# Patient Record
Sex: Female | Born: 1955 | Race: White | Hispanic: No | Marital: Married
Health system: Southern US, Community
[De-identification: ages and names within clinical notes are randomized; demographics above are authoritative.]

---

## 2003-04-18 ENCOUNTER — Other Ambulatory Visit: Payer: Self-pay

## 2006-12-18 ENCOUNTER — Other Ambulatory Visit: Payer: Self-pay

## 2006-12-19 ENCOUNTER — Inpatient Hospital Stay: Payer: Self-pay | Admitting: Surgery

## 2006-12-27 ENCOUNTER — Ambulatory Visit: Payer: Self-pay | Admitting: Surgery

## 2010-02-21 ENCOUNTER — Ambulatory Visit: Payer: Self-pay | Admitting: Internal Medicine

## 2012-01-26 ENCOUNTER — Ambulatory Visit: Payer: Self-pay | Admitting: Internal Medicine

## 2012-04-04 ENCOUNTER — Ambulatory Visit: Payer: Self-pay | Admitting: Internal Medicine

## 2013-08-24 ENCOUNTER — Ambulatory Visit: Payer: Self-pay | Admitting: Internal Medicine

## 2015-07-23 DIAGNOSIS — G8929 Other chronic pain: Secondary | ICD-10-CM | POA: Insufficient documentation

## 2015-08-19 ENCOUNTER — Other Ambulatory Visit: Payer: Self-pay | Admitting: Internal Medicine

## 2015-08-19 DIAGNOSIS — Z1231 Encounter for screening mammogram for malignant neoplasm of breast: Secondary | ICD-10-CM

## 2015-09-05 ENCOUNTER — Ambulatory Visit: Payer: Self-pay

## 2015-09-09 ENCOUNTER — Other Ambulatory Visit: Payer: Self-pay | Admitting: Internal Medicine

## 2015-09-09 ENCOUNTER — Ambulatory Visit
Admission: RE | Admit: 2015-09-09 | Discharge: 2015-09-09 | Disposition: A | Discharge status: Home / Self Care | Payer: BLUE CROSS/BLUE SHIELD | Source: Ambulatory Visit | Attending: Internal Medicine | Admitting: Internal Medicine

## 2015-09-09 DIAGNOSIS — Z1231 Encounter for screening mammogram for malignant neoplasm of breast: Secondary | ICD-10-CM

## 2015-11-14 DIAGNOSIS — E785 Hyperlipidemia, unspecified: Secondary | ICD-10-CM | POA: Insufficient documentation

## 2015-11-14 DIAGNOSIS — E039 Hypothyroidism, unspecified: Secondary | ICD-10-CM | POA: Insufficient documentation

## 2015-11-14 DIAGNOSIS — I10 Essential (primary) hypertension: Secondary | ICD-10-CM | POA: Insufficient documentation

## 2015-11-14 DIAGNOSIS — M1990 Unspecified osteoarthritis, unspecified site: Secondary | ICD-10-CM | POA: Insufficient documentation

## 2017-01-26 IMAGING — MG MM DIGITAL SCREENING BILAT W/ TOMO W/ CAD
8 of 13 series · 8 of 29 positions shown · non-contrast
Comparison: Previous exam(s).

CLINICAL DATA: Screening.

EXAM:
2D DIGITAL SCREENING BILATERAL MAMMOGRAM WITH CAD AND ADJUNCT TOMO

[L MLO (1 of 2)]
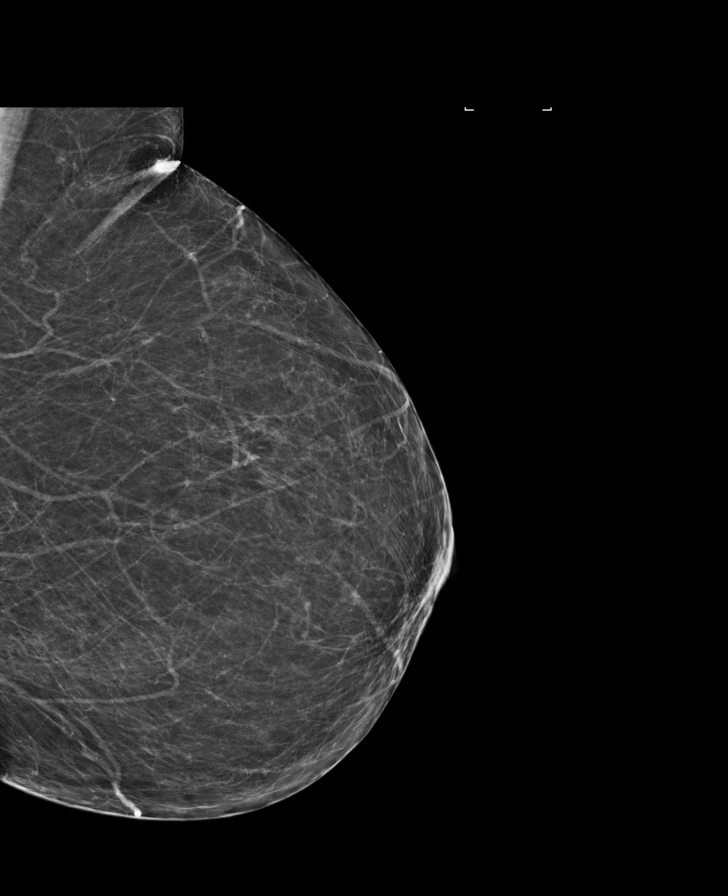

[R MLO synth-2D]
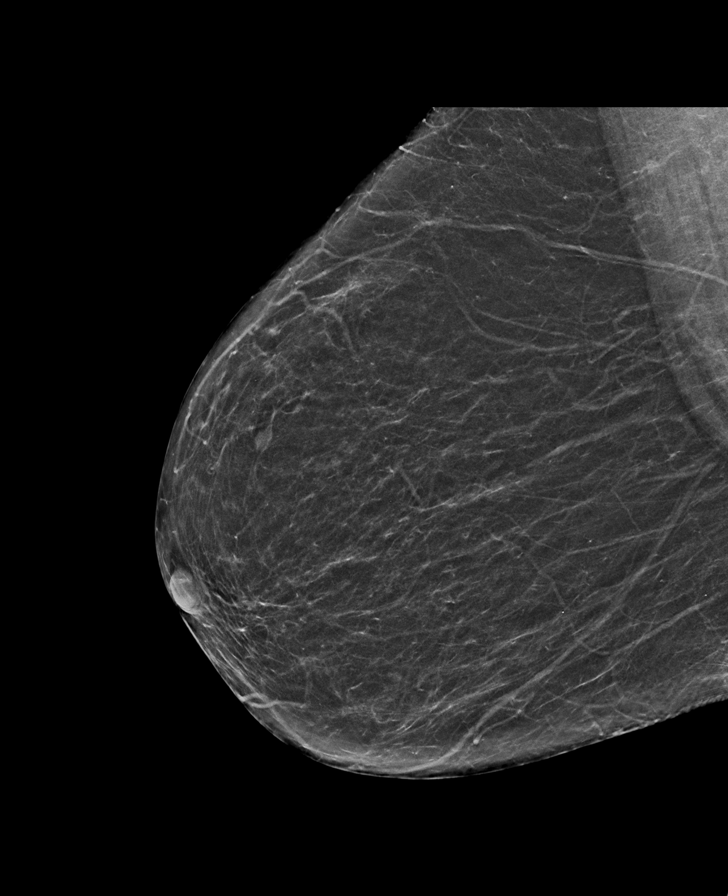

[R CC synth-2D]
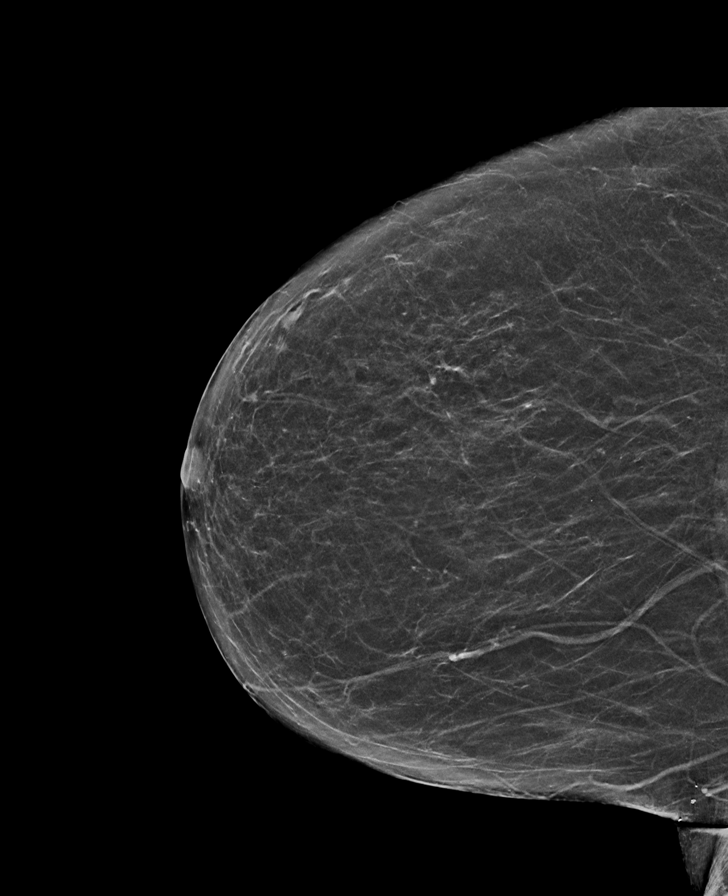

[R CC]
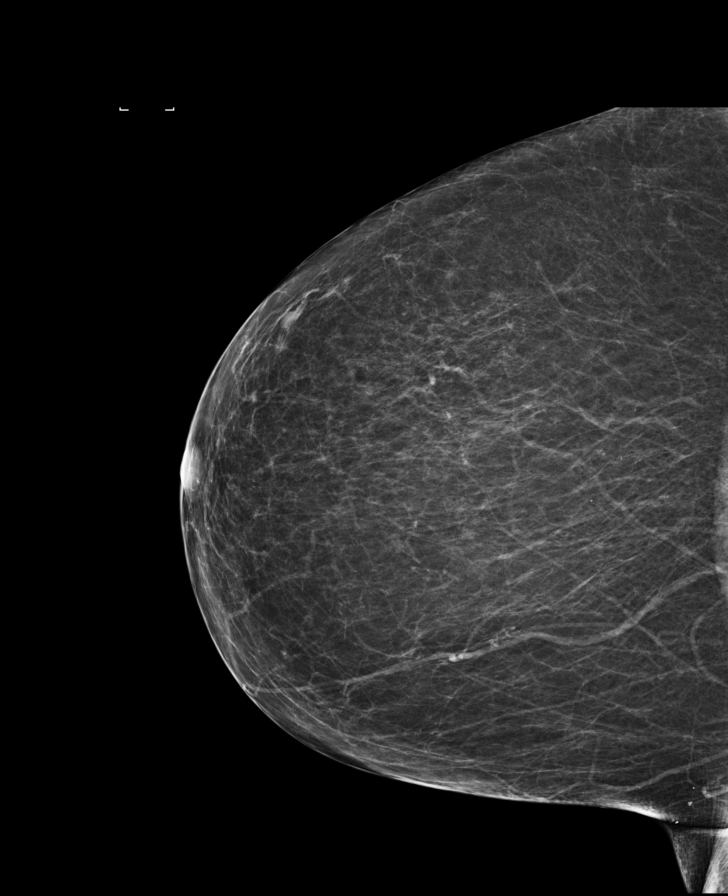

[L MLO synth-2D]
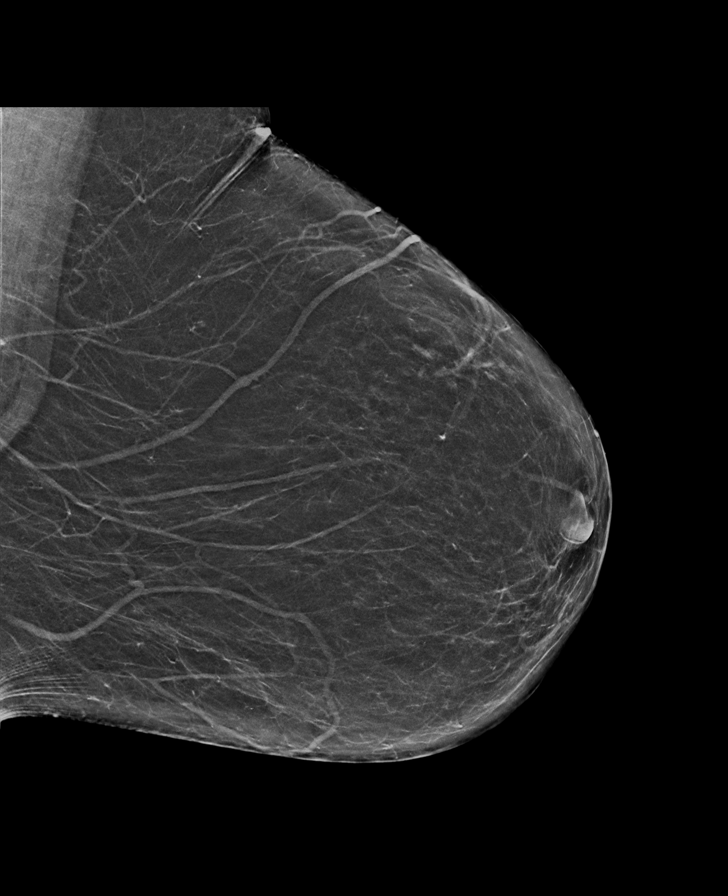

[L CC]
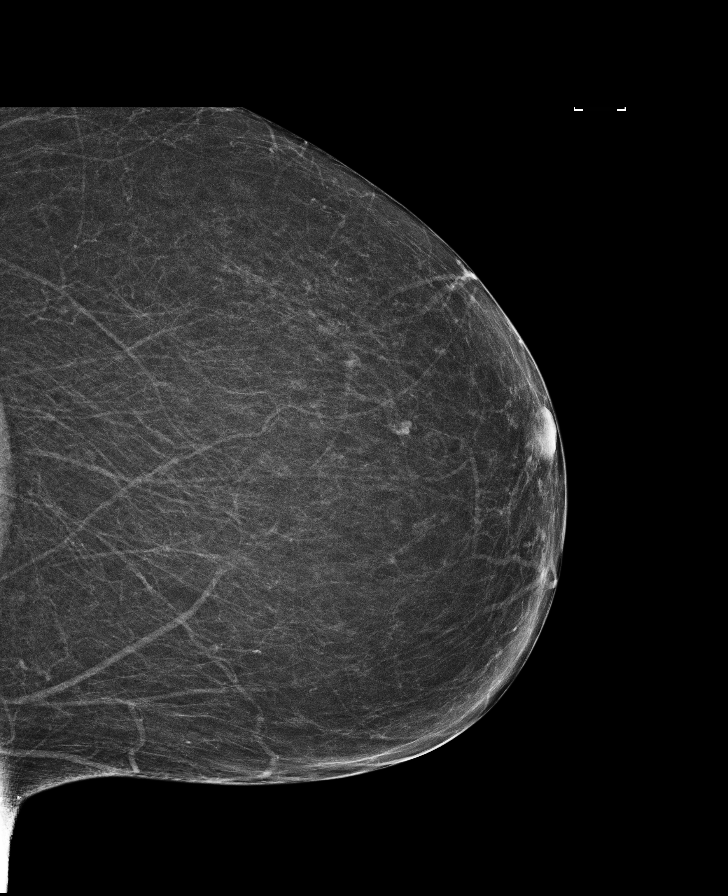

[L CC synth-2D]
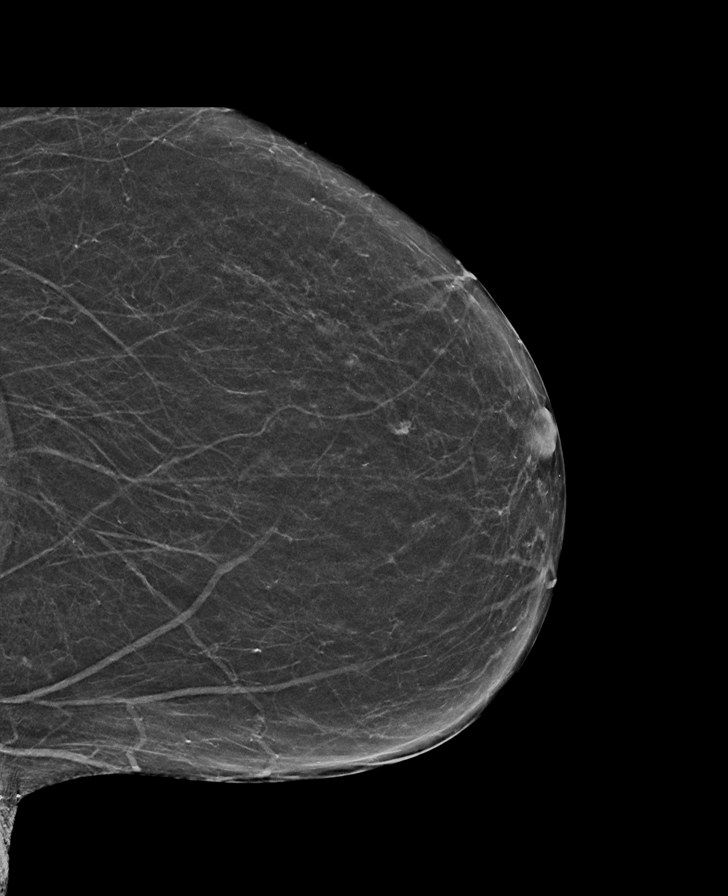

[L MLO (2 of 2)]
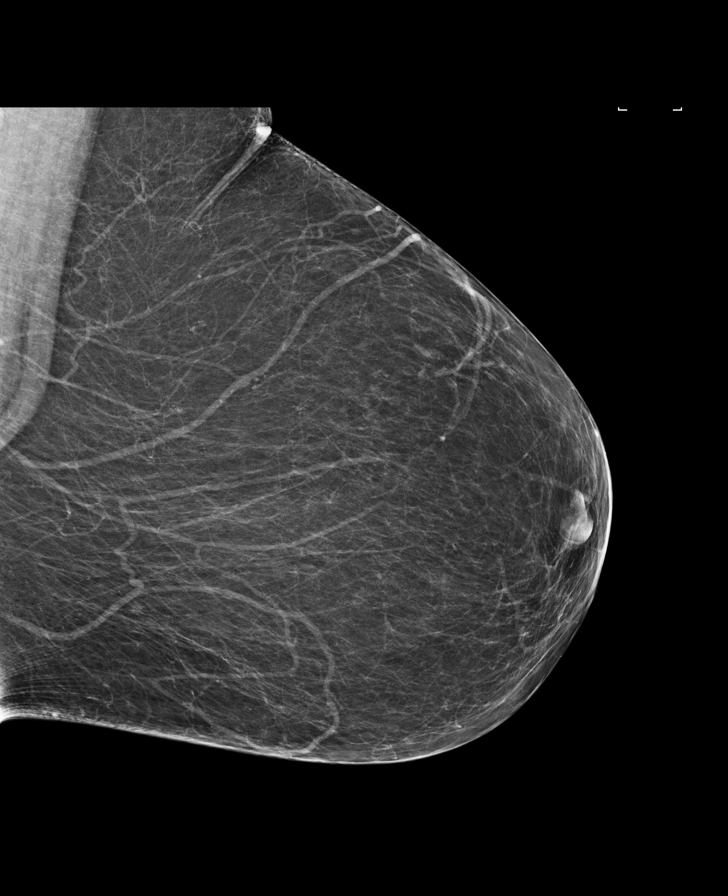

[8 of 29 positions shown; findings below may reference images not displayed]

ACR Breast Density Category b: There are scattered areas of
fibroglandular density.
FINDINGS: There are no findings suspicious for malignancy. Images were
processed with CAD.
IMPRESSION: No mammographic evidence of malignancy. A result letter of this
screening mammogram will be mailed directly to the patient.

RECOMMENDATION:
Screening mammogram in one year. (Code:97-6-RS4)

BI-RADS CATEGORY  1: Negative.

## 2017-03-26 ENCOUNTER — Other Ambulatory Visit: Payer: Self-pay | Admitting: Internal Medicine

## 2017-03-26 DIAGNOSIS — Z1231 Encounter for screening mammogram for malignant neoplasm of breast: Secondary | ICD-10-CM

## 2019-02-24 ENCOUNTER — Ambulatory Visit: Payer: PRIVATE HEALTH INSURANCE | Attending: Internal Medicine

## 2019-02-24 DIAGNOSIS — Z20822 Contact with and (suspected) exposure to covid-19: Secondary | ICD-10-CM

## 2019-02-24 DIAGNOSIS — U071 COVID-19: Secondary | ICD-10-CM | POA: Insufficient documentation

## 2019-02-25 LAB — NOVEL CORONAVIRUS, NAA: SARS-CoV-2, NAA: DETECTED — AB

## 2019-11-15 DIAGNOSIS — M7070 Other bursitis of hip, unspecified hip: Secondary | ICD-10-CM | POA: Insufficient documentation

## 2019-11-15 DIAGNOSIS — M47816 Spondylosis without myelopathy or radiculopathy, lumbar region: Secondary | ICD-10-CM | POA: Insufficient documentation

## 2019-11-15 DIAGNOSIS — M1610 Unilateral primary osteoarthritis, unspecified hip: Secondary | ICD-10-CM | POA: Insufficient documentation

## 2019-11-16 ENCOUNTER — Other Ambulatory Visit: Payer: Self-pay

## 2019-11-16 ENCOUNTER — Ambulatory Visit: Payer: PRIVATE HEALTH INSURANCE | Admitting: Podiatry

## 2019-11-16 ENCOUNTER — Ambulatory Visit (INDEPENDENT_AMBULATORY_CARE_PROVIDER_SITE_OTHER): Payer: PRIVATE HEALTH INSURANCE

## 2019-11-16 ENCOUNTER — Encounter: Payer: Self-pay | Admitting: Podiatry

## 2019-11-16 DIAGNOSIS — M19279 Secondary osteoarthritis, unspecified ankle and foot: Secondary | ICD-10-CM

## 2019-11-16 DIAGNOSIS — M1990 Unspecified osteoarthritis, unspecified site: Secondary | ICD-10-CM | POA: Diagnosis not present

## 2019-11-16 DIAGNOSIS — M205X1 Other deformities of toe(s) (acquired), right foot: Secondary | ICD-10-CM | POA: Diagnosis not present

## 2019-11-16 NOTE — Progress Notes (Signed)
Subjective:  Patient ID: Paige Coffey, female    DOB: 01-26-1956,  MRN: [id number]  Chief Complaint  Patient presents with  . Foot Pain    Patient presents today for pain in right foot/1st mpj and forefoot x 3-4 months    64 y.o. female presents with the above complaint.  Patient presents with complaint of right first metatarsophalangeal joint pain that has been going on for quite some time.  Patient states is painful to touch.  Patient states is been eating for 3 to 4 months has progressively gotten worse.  Sometimes burn.  There is pins-and-needles associated with it.  Patient's tried icing it ibuprofen which has not helped much.  She would like to discuss treatment options she has not seen anyone else prior to seeing me.   Review of Systems: Negative except as noted in the HPI. Denies N/V/F/Ch.  History reviewed. No pertinent past medical history.  Current Outpatient Medications:  .  indomethacin (INDOCIN SR) 75 MG CR capsule, indomethacin ER 75 mg capsule,extended release  TAKE 1 CAPSULE BY MOUTH TWICE A DAY, Disp: , Rfl:  .  lisinopril-hydrochlorothiazide (ZESTORETIC) 20-12.5 MG tablet, lisinopril 20 mg-hydrochlorothiazide 12.5 mg tablet  TK 1 T PO D, Disp: , Rfl:  .  aspirin 81 MG EC tablet, Take by mouth., Disp: , Rfl:  .  atorvastatin (LIPITOR) 20 MG tablet, atorvastatin 20 mg tablet  TK 1 T PO QD, Disp: , Rfl:  .  levothyroxine (SYNTHROID) 100 MCG tablet, levothyroxine 100 mcg tablet  TK 1 T PO QD, Disp: , Rfl:   Social History   Tobacco Use  Smoking Status Not on file    No Known Allergies Objective:  There were no vitals filed for this visit. There is no height or weight on file to calculate BMI. Constitutional Well developed. Well nourished.  Vascular Dorsalis pedis pulses palpable bilaterally. Posterior tibial pulses palpable bilaterally. Capillary refill normal to all digits.  No cyanosis or clubbing noted. Pedal hair growth normal.  Neurologic Normal  speech. Oriented to person, place, and time. Epicritic sensation to light touch grossly present bilaterally.  Dermatologic Nails well groomed and normal in appearance. No open wounds. No skin lesions.  Orthopedic:  Pain on palpation of first tarsaometatarsophalangeal joint plantar aspect.  Pain with mild touch to the sesamoidal complex as well but not as much as the first tarsometatarsal joint.  Pain along the dorsal midfoot at the first TMT J joint.  Mild intra-articular pain noted as well.  Hallux limitus noted to the first metatarsal phalangeal joint range of motion active and passive   Radiographs: 3 views of skeletally mature adult right foot: Mild osteoarthritic changes with underlying bunion deformity noted to the right first metatarsophalangeal joint.  Osteoarthritis noted to the first TMT J joint as well as most of the midfoot. Assessment:   1. Hallux limitus of right foot   2. 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  Right first tarsometatarsal joint arthritis -I explained patient the etiology of arthritis and various treatment options were discussed.  Given the amount of pain that she is having I believe she will benefit from a steroid injection to help decrease the acute inflammatory component associated pain.  Patient agrees with the plan would like to proceed with a steroid injection -A steroid injection was performed at right first tarsometatarsal joint using 1% plain Lidocaine and 10 mg of Kenalog. This was  well tolerated. -I also discussed with her the importance of shoe gear modification as well.   No follow-ups on file.

## 2019-12-28 ENCOUNTER — Ambulatory Visit: Payer: PRIVATE HEALTH INSURANCE | Admitting: Podiatry

## 2020-01-02 ENCOUNTER — Other Ambulatory Visit: Payer: Self-pay

## 2020-01-02 ENCOUNTER — Encounter: Payer: Self-pay | Admitting: Podiatry

## 2020-01-02 ENCOUNTER — Ambulatory Visit (INDEPENDENT_AMBULATORY_CARE_PROVIDER_SITE_OTHER): Payer: PRIVATE HEALTH INSURANCE | Admitting: Podiatry

## 2020-01-02 DIAGNOSIS — M19279 Secondary osteoarthritis, unspecified ankle and foot: Secondary | ICD-10-CM

## 2020-01-02 DIAGNOSIS — M1990 Unspecified osteoarthritis, unspecified site: Secondary | ICD-10-CM | POA: Diagnosis not present

## 2020-01-02 DIAGNOSIS — M19271 Secondary osteoarthritis, right ankle and foot: Secondary | ICD-10-CM | POA: Diagnosis not present

## 2020-01-02 NOTE — Progress Notes (Signed)
  Subjective:  Patient ID: Paige Coffey, female    DOB: [birth year],  MRN: [id number]  Chief Complaint  Patient presents with  . Foot Pain    "its no better.  My joint hurts and shoots pain across the top of my foot and toes"    64 y.o. female presents with the above complaint.  Patient presents with follow-up of right first tarsometatarsal phalangeal joint pain.  Patient states that the injection did not help at all.  She is try some shoe gear modification which helped a little bit.  She would ultimately like to discuss surgical intervention after all of her conservative treatment has failed.  Given the amount of pain that she is having I believe patient may benefit from cam boot immobilization.   Review of Systems: Negative except as noted in the HPI. Denies N/V/F/Ch.  No past medical history on file.  Current Outpatient Medications:  .  aspirin 81 MG EC tablet, Take by mouth., Disp: , Rfl:  .  atorvastatin (LIPITOR) 20 MG tablet, atorvastatin 20 mg tablet  TK 1 T PO QD, Disp: , Rfl:  .  indomethacin (INDOCIN SR) 75 MG CR capsule, indomethacin ER 75 mg capsule,extended release  TAKE 1 CAPSULE BY MOUTH TWICE A DAY, Disp: , Rfl:  .  levothyroxine (SYNTHROID) 100 MCG tablet, levothyroxine 100 mcg tablet  TK 1 T PO QD, Disp: , Rfl:  .  lisinopril-hydrochlorothiazide (ZESTORETIC) 20-12.5 MG tablet, lisinopril 20 mg-hydrochlorothiazide 12.5 mg tablet  TK 1 T PO D, Disp: , Rfl:   Social History   Tobacco Use  Smoking Status Not on file    No Known Allergies Objective:  There were no vitals filed for this visit. There is no height or weight on file to calculate BMI. Constitutional Well developed. Well nourished.  Vascular Dorsalis pedis pulses palpable bilaterally. Posterior tibial pulses palpable bilaterally. Capillary refill normal to all digits.  No cyanosis or clubbing noted. Pedal hair growth normal.  Neurologic Normal speech. Oriented to person, place, and  time. Epicritic sensation to light touch grossly present bilaterally.  Dermatologic Nails well groomed and normal in appearance. No open wounds. No skin lesions.  Orthopedic:  Pain on palpation of first tarsaometatarsophalangeal joint plantar aspect.  Pain with mild touch to the sesamoidal complex as well but not as much as the first tarsometatarsal joint.  Pain along the dorsal midfoot at the first TMT J joint.  Mild intra-articular pain noted as well.  Hallux limitus noted to the first metatarsal phalangeal joint range of motion active and passive   Radiographs: 3 views of skeletally mature adult right foot: Mild osteoarthritic changes with underlying bunion deformity noted to the right first metatarsophalangeal joint.  Osteoarthritis noted to the first TMT J joint as well as most of the midfoot. Assessment:   No diagnosis found. Plan:  Patient was evaluated and treated and all questions answered.  Right first tarsometatarsal joint arthritis -Clinically patient improved with a steroid injection.  At this time given that she has continuous pain I will issue benefit from cam boot immobilization.  Cam boot was dispensed to the pharmacy. -We will hold off on any further injections for now. -If there is no improvement we will discuss CT scan versus surgical fusion.  Patient states understanding   No follow-ups on file.

## 2020-02-13 ENCOUNTER — Ambulatory Visit (INDEPENDENT_AMBULATORY_CARE_PROVIDER_SITE_OTHER): Payer: PRIVATE HEALTH INSURANCE | Admitting: Podiatry

## 2020-02-13 ENCOUNTER — Other Ambulatory Visit: Payer: Self-pay

## 2020-02-13 ENCOUNTER — Encounter: Payer: Self-pay | Admitting: Podiatry

## 2020-02-13 DIAGNOSIS — M19079 Primary osteoarthritis, unspecified ankle and foot: Secondary | ICD-10-CM

## 2020-02-13 NOTE — Progress Notes (Signed)
  Subjective:  Patient ID: Paige Coffey, female    DOB: [birth year],  MRN: [id number]  Chief Complaint  Patient presents with  . Foot Pain    "its about the same, no better, but no worse"    65 y.o. female presents with the above complaint.  Patient presents with follow-up of right first tarsometatarsal phalangeal joint pain.  Patient states that the injection did not help at all. sHe stated that she is not able to tolerate the cam boot at all. She still has pain. She would like to discuss what the future treatment plans are   Review of Systems: Negative except as noted in the HPI. Denies N/V/F/Ch.  No past medical history on file.  Current Outpatient Medications:  .  aspirin 81 MG EC tablet, Take by mouth., Disp: , Rfl:  .  atorvastatin (LIPITOR) 20 MG tablet, atorvastatin 20 mg tablet  TK 1 T PO QD, Disp: , Rfl:  .  indomethacin (INDOCIN SR) 75 MG CR capsule, indomethacin ER 75 mg capsule,extended release  TAKE 1 CAPSULE BY MOUTH TWICE A DAY, Disp: , Rfl:  .  levothyroxine (SYNTHROID) 100 MCG tablet, levothyroxine 100 mcg tablet  TK 1 T PO QD, Disp: , Rfl:  .  lisinopril-hydrochlorothiazide (ZESTORETIC) 20-12.5 MG tablet, lisinopril 20 mg-hydrochlorothiazide 12.5 mg tablet  TK 1 T PO D, Disp: , Rfl:   Social History   Tobacco Use  Smoking Status Not on file  Smokeless Tobacco Not on file    No Known Allergies Objective:  There were no vitals filed for this visit. There is no height or weight on file to calculate BMI. Constitutional Well developed. Well nourished.  Vascular Dorsalis pedis pulses palpable bilaterally. Posterior tibial pulses palpable bilaterally. Capillary refill normal to all digits.  No cyanosis or clubbing noted. Pedal hair growth normal.  Neurologic Normal speech. Oriented to person, place, and time. Epicritic sensation to light touch grossly present bilaterally.  Dermatologic Nails well groomed and normal in appearance. No open wounds. No  skin lesions.  Orthopedic:  Pain on palpation of first tarsaometatarsophalangeal joint plantar aspect.  Pain with mild touch to the sesamoidal complex as well but not as much as the first tarsometatarsal joint.  Pain along the dorsal midfoot at the first TMT J joint.  Mild intra-articular pain noted as well.  Hallux limitus noted to the first metatarsal phalangeal joint range of motion active and passive   Radiographs: 3 views of skeletally mature adult right foot: Mild osteoarthritic changes with underlying bunion deformity noted to the right first metatarsophalangeal joint.  Osteoarthritis noted to the first TMT J joint as well as most of the midfoot. Assessment:   1. 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  Right first tarsometatarsal joint arthritis -Clinically patient pain has returns and continues to get painful. Cam boot did not provide him enough immobilization for arthritis to calm down. At this point I believe patient will benefit of CT scan however patient is undergoing change of insurance I would like to hold off on CT scan until next clinical visit if there is no improvement by that time patient will benefit from a CT scan. -Patient may be a candidate for first tarsometatarsal joint fusion   No follow-ups on file.

## 2020-03-26 ENCOUNTER — Ambulatory Visit: Payer: PRIVATE HEALTH INSURANCE | Admitting: Podiatry

## 2021-09-10 ENCOUNTER — Other Ambulatory Visit: Payer: Self-pay | Admitting: Internal Medicine

## 2021-09-10 DIAGNOSIS — Z1231 Encounter for screening mammogram for malignant neoplasm of breast: Secondary | ICD-10-CM

## 2021-10-01 ENCOUNTER — Ambulatory Visit
Admission: RE | Admit: 2021-10-01 | Discharge: 2021-10-01 | Disposition: A | Discharge status: Home / Self Care | Payer: Medicare HMO | Source: Ambulatory Visit | Attending: Internal Medicine | Admitting: Internal Medicine

## 2021-10-01 DIAGNOSIS — Z1231 Encounter for screening mammogram for malignant neoplasm of breast: Secondary | ICD-10-CM | POA: Insufficient documentation

## 2022-03-20 ENCOUNTER — Other Ambulatory Visit: Payer: Self-pay | Admitting: Internal Medicine

## 2022-03-20 DIAGNOSIS — R7989 Other specified abnormal findings of blood chemistry: Secondary | ICD-10-CM

## 2022-04-07 ENCOUNTER — Ambulatory Visit
Admission: RE | Admit: 2022-04-07 | Discharge: 2022-04-07 | Disposition: A | Discharge status: Home / Self Care | Payer: Medicare HMO | Source: Ambulatory Visit | Attending: Internal Medicine | Admitting: Internal Medicine

## 2022-04-07 DIAGNOSIS — R7989 Other specified abnormal findings of blood chemistry: Secondary | ICD-10-CM

## 2022-04-23 ENCOUNTER — Encounter: Payer: Self-pay | Admitting: Cardiovascular Disease

## 2022-05-28 ENCOUNTER — Emergency Department: Payer: Medicare HMO

## 2022-05-28 ENCOUNTER — Emergency Department
Admission: EM | Admit: 2022-05-28 | Discharge: 2022-05-29 | Disposition: A | Discharge status: Home / Self Care | Payer: Medicare HMO | Attending: Emergency Medicine | Admitting: Emergency Medicine

## 2022-05-28 DIAGNOSIS — R1013 Epigastric pain: Secondary | ICD-10-CM | POA: Insufficient documentation

## 2022-05-28 DIAGNOSIS — E039 Hypothyroidism, unspecified: Secondary | ICD-10-CM | POA: Insufficient documentation

## 2022-05-28 DIAGNOSIS — R1011 Right upper quadrant pain: Secondary | ICD-10-CM | POA: Diagnosis present

## 2022-05-28 DIAGNOSIS — I10 Essential (primary) hypertension: Secondary | ICD-10-CM | POA: Insufficient documentation

## 2022-05-28 DIAGNOSIS — R7401 Elevation of levels of liver transaminase levels: Secondary | ICD-10-CM | POA: Diagnosis not present

## 2022-05-28 DIAGNOSIS — K2900 Acute gastritis without bleeding: Secondary | ICD-10-CM

## 2022-05-28 LAB — HEPATIC FUNCTION PANEL
ALT: 354 U/L — ABNORMAL HIGH (ref 0–44)
AST: 261 U/L — ABNORMAL HIGH (ref 15–41)
Albumin: 2.9 g/dL — ABNORMAL LOW (ref 3.5–5.0)
Alkaline Phosphatase: 138 U/L — ABNORMAL HIGH (ref 38–126)
Bilirubin, Direct: 0.8 mg/dL — ABNORMAL HIGH (ref 0.0–0.2)
Indirect Bilirubin: 1.3 mg/dL — ABNORMAL HIGH (ref 0.3–0.9)
Total Bilirubin: 2.1 mg/dL — ABNORMAL HIGH (ref 0.3–1.2)
Total Protein: 6.7 g/dL (ref 6.5–8.1)

## 2022-05-28 LAB — BASIC METABOLIC PANEL
Anion gap: 7 (ref 5–15)
BUN: 28 mg/dL — ABNORMAL HIGH (ref 8–23)
CO2: 24 mmol/L (ref 22–32)
Calcium: 9.5 mg/dL (ref 8.9–10.3)
Chloride: 107 mmol/L (ref 98–111)
Creatinine, Ser: 1.08 mg/dL — ABNORMAL HIGH (ref 0.44–1.00)
GFR, Estimated: 56 mL/min — ABNORMAL LOW (ref >60)
Glucose, Bld: 120 mg/dL — ABNORMAL HIGH (ref 70–99)
Potassium: 3.9 mmol/L (ref 3.5–5.1)
Sodium: 138 mmol/L (ref 135–145)

## 2022-05-28 LAB — CBC WITH DIFFERENTIAL/PLATELET
Abs Immature Granulocytes: 0.02 10*3/uL (ref 0.00–0.07)
Basophils Absolute: 0.2 10*3/uL — ABNORMAL HIGH (ref 0.0–0.1)
Basophils Relative: 2 %
Eosinophils Absolute: 0.3 10*3/uL (ref 0.0–0.5)
Eosinophils Relative: 3 %
HCT: 40.7 % (ref 36.0–46.0)
Hemoglobin: 13.5 g/dL (ref 12.0–15.0)
Immature Granulocytes: 0 %
Lymphocytes Relative: 25 %
Lymphs Abs: 2.8 10*3/uL (ref 0.7–4.0)
MCH: 31.5 pg (ref 26.0–34.0)
MCHC: 33.2 g/dL (ref 30.0–36.0)
MCV: 94.9 fL (ref 80.0–100.0)
Monocytes Absolute: 1.1 10*3/uL — ABNORMAL HIGH (ref 0.1–1.0)
Monocytes Relative: 10 %
Neutro Abs: 6.8 10*3/uL (ref 1.7–7.7)
Neutrophils Relative %: 60 %
Platelets: 328 10*3/uL (ref 150–400)
RBC: 4.29 MIL/uL (ref 3.87–5.11)
RDW: 14.9 % (ref 11.5–15.5)
WBC: 11.2 10*3/uL — ABNORMAL HIGH (ref 4.0–10.5)
nRBC: 0 % (ref 0.0–0.2)

## 2022-05-28 LAB — LIPASE, BLOOD: Lipase: 40 U/L (ref 11–51)

## 2022-05-28 MED ORDER — MORPHINE SULFATE (PF) 4 MG/ML IV SOLN
4.0000 mg | Freq: Once | INTRAVENOUS | Status: AC
  Administered 2022-05-28: 4 mg via INTRAVENOUS
  Filled 2022-05-28: qty 1

## 2022-05-28 MED ORDER — LACTATED RINGERS IV BOLUS
1000.0000 mL | Freq: Once | INTRAVENOUS | Status: AC
  Administered 2022-05-28: 1000 mL via INTRAVENOUS

## 2022-05-28 MED ORDER — IOHEXOL 300 MG/ML  SOLN
100.0000 mL | Freq: Once | INTRAMUSCULAR | Status: AC | PRN
  Administered 2022-05-28: 100 mL via INTRAVENOUS

## 2022-05-28 MED ORDER — ONDANSETRON HCL 4 MG/2ML IJ SOLN
4.0000 mg | Freq: Once | INTRAMUSCULAR | Status: AC
  Administered 2022-05-28: 4 mg via INTRAVENOUS
  Filled 2022-05-28: qty 2

## 2022-05-28 NOTE — ED Provider Notes (Signed)
[facility] Provider Note    Event Date/Time   First MD Initiated Contact with Patient 05/28/22 2242     (approximate)   History   Abdominal Pain   HPI  Paige Coffey is a 67 y.o. female  with pmh HTN, hypothyroidism who p/w nausea and abdominal pain.  For about a month patient has had ongoing nausea.  Has not actually vomited.  Still trying to eat and drink.  She has abdominal pain that is rather constant in the epigastrium radiating to the right back.  Denies diarrhea or constipation.  No fevers or chills.  No urinary symptoms.  Has about a 6 pound weight loss.  Saw her primary doctor and did have labs that showed elevated LFTs.  It was a RUQ ultrasound that showed no abnormal findings.  Has had her gallbladder removed.     No past medical history on file.  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/15/2019   Bursitis of hip 11/15/2019   HLD (hyperlipidemia) 11/14/2015   HTN (hypertension) 11/14/2015   Hypothyroidism 11/14/2015   Osteoarthritis 11/14/2015   Chronic pain of right hip 07/23/2015     Physical Exam  Triage Vital Signs: ED Triage Vitals  Enc Vitals Group     BP 05/28/22 2041 119/80     Pulse Rate 05/28/22 2041 95     Resp 05/28/22 2041 18     Temp 05/28/22 2041 97.9 F (36.6 C)     Temp Source 05/28/22 2041 Oral     SpO2 05/28/22 2041 98 %     Weight 05/28/22 2040 214 lb (97.1 kg)     Height 05/28/22 2040  (1.575 m)     Head Circumference --      Peak Flow --      Pain Score 05/28/22 2040 8     Pain Loc --      Pain Edu? --      Excl. in GC? --     Most recent vital signs: Vitals:   05/28/22 2041  BP: 119/80  Pulse: 95  Resp: 18  Temp: 97.9 F (36.6 C)  SpO2: 98%     General: Awake, no distress.  CV:  Good peripheral perfusion.  Resp:  Normal effort.  Abd:  No distention.  Mild tenderness in the epigastric region but no  guarding local hernia is present is reducible Neuro:             Awake, Alert, Oriented x 3  Other:     ED Results / Procedures / Treatments  Labs (all labs ordered are listed, but only abnormal results are displayed) Labs Reviewed  CBC WITH DIFFERENTIAL/PLATELET - Abnormal; Notable for the following components:      Result Value   WBC 11.2 (*)    Monocytes Absolute 1.1 (*)    Basophils Absolute 0.2 (*)    All other components within normal limits  BASIC METABOLIC PANEL - Abnormal; Notable for the following components:   Glucose, Bld 120 (*)    BUN 28 (*)    Creatinine, Ser 1.08 (*)    GFR, Estimated 56 (*)    All other components within normal limits  HEPATIC FUNCTION PANEL - Abnormal; Notable for the following components:   Albumin 2.9 (*)    AST 261 (*)    ALT 354 (*)    Alkaline Phosphatase 138 (*)  Total Bilirubin 2.1 (*)    Bilirubin, Direct 0.8 (*)    Indirect Bilirubin 1.3 (*)    All other components within normal limits  LIPASE, BLOOD     EKG     RADIOLOGY    PROCEDURES:  Critical Care performed: No  Procedures {  MEDICATIONS ORDERED IN ED: Medications  lactated ringers bolus 1,000 mL (1,000 mLs Intravenous New Bag/Given 05/28/22 2251)  ondansetron (ZOFRAN) injection 4 mg (4 mg Intravenous Given 05/28/22 2251)  morphine (PF) 4 MG/ML injection 4 mg (4 mg Intravenous Given 05/28/22 2251)  iohexol (OMNIPAQUE) 300 MG/ML solution 100 mL (100 mLs Intravenous Contrast Given 05/28/22 2304)     IMPRESSION / MDM / ASSESSMENT AND PLAN / ED COURSE  I reviewed the triage vital signs and the nursing notes.                              Patient's presentation is most consistent with acute complicated illness / injury requiring diagnostic workup.  Differential diagnosis includes, but is not limited to malignancy including pancreatic cancer, cholangiocarcinoma, biliary obstruction, biliary stricture  Patient is a 67 year old female presents with 1 month of  nausea weight loss and abdominal pain.  She had labs earlier this year that showed transaminitis and had an ultrasound of the right upper quadrant that did not have any significant abnormalities.  Patient presents today because of ongoing nausea and abdominal discomfort.  No acute change in symptoms.  Patient's vitals are reassuring overall she looks well.  Abdominal exam is notable for tenderness in the epigastric region right upper quadrant.  Today AST ALT are elevated but are improving from prior.  Will obtain a CT abdomen pelvis to evaluate for mass or significant biliary dilatation.  If the bile ducts are dilated will need MRCP.  CT shows likely gastritis and duodenitis.  There is no dilation of the CBD but there is intrahepatic biliary dilatation.  Given patient's ongoing transaminitis will order MRCP.  Signed out to oncoming rider pending results of MRCP.      FINAL CLINICAL IMPRESSION(S) / ED DIAGNOSES   Final diagnoses:  Transaminitis     Rx / DC Orders   ED Discharge Orders     None        Note:  This document was prepared using Dragon voice recognition software and may include unintentional dictation errors.   Georga Hacking, MD 05/28/22 [phone]

## 2022-05-28 NOTE — ED Triage Notes (Signed)
Pt states she has hx of elevated liver enzymes, Pt c/o RUQ abd pain and dec appetite, and nausea for the past few months.

## 2022-05-29 DIAGNOSIS — R1013 Epigastric pain: Secondary | ICD-10-CM | POA: Diagnosis not present

## 2022-05-29 MED ORDER — GADOBUTROL 1 MMOL/ML IV SOLN
9.0000 mL | Freq: Once | INTRAVENOUS | Status: AC | PRN
  Administered 2022-05-29: 9 mL via INTRAVENOUS

## 2022-05-29 MED ORDER — ONDANSETRON 4 MG PO TBDP: 4.0000 mg | ORAL_TABLET | Freq: Three times a day (TID) | ORAL | 0 refills | Status: DC | PRN

## 2022-05-29 NOTE — ED Provider Notes (Signed)
Patient received in sign out from Dr. Sidney Ace pending MRCP to evaluate for choledocholithiasis in the setting of epigastric discomfort, nausea and transaminitis.  No evidence of choledocholithiasis on this study.  Imaging overall is concerning for gastritis.  Will start the patient on H2 blocker.  Apparently these LFT abnormalities are improving from outpatient results recently.  Will have the patient follow-up with her PCP and we discussed return precautions.   Delton Prairie, MD 05/29/22 [phone]

## 2022-05-29 NOTE — Discharge Instructions (Addendum)
As we discussed, please pick up over-the-counter famotidine/Pepcid start taking once daily every day to help prevent excessive acid production in the stomach.  Also sent a prescription for Zofran to the pharmacy to use as needed for any further nausea and vomiting.  We did repeat the liver numbers today in the ER.  Please see your MyChart results for these results.  Follow-up with your PCP

## 2022-07-20 ENCOUNTER — Other Ambulatory Visit: Payer: Self-pay

## 2022-07-21 ENCOUNTER — Other Ambulatory Visit: Payer: Self-pay

## 2022-07-21 ENCOUNTER — Ambulatory Visit (INDEPENDENT_AMBULATORY_CARE_PROVIDER_SITE_OTHER): Payer: Medicare HMO | Admitting: Gastroenterology

## 2022-07-21 VITALS — BP 108/75 | HR 94 | Temp 98.1°F | Ht 62.0 in | Wt 214.0 lb

## 2022-07-21 DIAGNOSIS — R7989 Other specified abnormal findings of blood chemistry: Secondary | ICD-10-CM

## 2022-07-21 DIAGNOSIS — Z87898 Personal history of other specified conditions: Secondary | ICD-10-CM

## 2022-07-21 MED ORDER — OMEPRAZOLE 40 MG PO CPDR: 40.0000 mg | DELAYED_RELEASE_CAPSULE | Freq: Two times a day (BID) | ORAL | 0 refills | Status: AC

## 2022-07-21 NOTE — Progress Notes (Signed)
Wyline Mood MD, MRCP(U.K) [address]  [address]  [place], [place] [address]  Main: [phone]  Fax: [phone]   Gastroenterology Consultation  Referring Provider:     Jaclyn Shaggy, MD Primary Care Physician:  Jaclyn Shaggy, MD Primary Gastroenterologist:  Dr. Wyline Mood  Reason for Consultation:     abnormal LFT's         HPI:   Paige Coffey is a 67 y.o. y/o female referred for consultation & management  by Dr. Jaclyn Shaggy, MD.     . First noted abnormality in LFT's in 03/2022.  Seen in the emergency room on 05/28/2022 when she presented with abdominal pain in the epigastric area going on for a month along with nausea.  6 pound weight loss.  Underwent imaging as below was normal. Alcohol use none*  Drug use none Over the counter herbal supplements none none New medications none Abdominal pain yes, bilaterally below her breasts going on for more than a year.  Sometimes worse when she eats nonradiating sharp in nature, not affected by bowel movement has bowel movement once in 3 days does not think she is constipated Tums helps her last for a very short.  Of time.  Denies any NSAID use. Tattoos none No family history of liver disease.  Last colonoscopy was during the pandemic she recollects was normal never had an upper endoscopy.   04/07/2022: RUQ USG: normal , no gall bladder -resected. 05/28/2022: Ct abdomen and pelvis : Possible gastritis/duodenitis , RUQ ventral hernia , complex peri umbilical ventral abdominal wall hernia , diverticulosis. Mild central intrahepatic biliary ductal dilatation, likely secondary to prior cholecystectomy   05/29/2022: MRCP: Hepatic steatosis , ventral hernia.     Latest Ref Rng & Units 05/28/2022    8:43 PM  Hepatic Function  Total Protein 6.5 - 8.1 g/dL 6.7   Albumin 3.5 - 5.0 g/dL 2.9   AST 15 - 41 U/L 130   ALT 0 - 44 U/L 354   Alk Phosphatase 38 - 126 U/L 138   Total Bilirubin 0.3 - 1.2 mg/dL 2.1   Bilirubin, Direct  0.0 - 0.2 mg/dL 0.8     LFT's : 09/6576: albumin 3.5, AST 450, ALT 707, Alk phos 226.  Acute hepatitis panel : negative.  No past medical history on file.  No past surgical history on file.  Prior to Admission medications   Medication Sig Start Date End Date Taking? Authorizing Provider  aspirin 81 MG EC tablet Take by mouth.    [provider]  atorvastatin (LIPITOR) 20 MG tablet atorvastatin 20 mg tablet  TK 1 T PO QD    [provider]  indomethacin (INDOCIN SR) 75 MG CR capsule indomethacin ER 75 mg capsule,extended release  TAKE 1 CAPSULE BY MOUTH TWICE A DAY 04/03/17   [provider]  levothyroxine (SYNTHROID) 100 MCG tablet levothyroxine 100 mcg tablet  TK 1 T PO QD    [provider]  lisinopril-hydrochlorothiazide (ZESTORETIC) 20-12.5 MG tablet lisinopril 20 mg-hydrochlorothiazide 12.5 mg tablet  TK 1 T PO D 01/07/16   [provider]  ondansetron (ZOFRAN-ODT) 4 MG disintegrating tablet Take 1 tablet (4 mg total) by mouth every 8 (eight) hours as needed. 05/29/22   Delton Prairie, MD    No family history on file.      Allergies as of 07/21/2022   (No Known Allergies)    Review of Systems:    All systems reviewed and  negative except where noted in HPI.   Physical Exam:  BP 108/75   Pulse 94   Temp 98.1 F (36.7 C) (Oral)   Ht 5\' 2"  (1.575 m)   Wt 214 lb (97.1 kg)   BMI 39.14 kg/m  No LMP recorded. Patient has had a hysterectomy. Psych:  Alert and cooperative. Normal mood and affect. General:   Alert,  Well-developed, well-nourished, pleasant and cooperative in NAD Head:  Normocephalic and atraumatic. Eyes:  Sclera clear, no icterus.   Conjunctiva pink. Ears:  Normal auditory acuity. Abdomen:  Normal bowel sounds.  No bruits.  Soft, non-tender and non-distended without masses, hepatosplenomegaly or hernias noted.  No guarding or rebound tenderness.    Neurologic:  Alert and oriented x3;  grossly normal  neurologically. Psych:  Alert and cooperative. Normal mood and affect.  Imaging Studies: No results found.  Assessment and Plan:   Paige Coffey is a 67 y.o. y/o female has been referred for elevated liver function tests first noted February 2024.  She has some dyspeptic symptoms which have responded to Tums but the relief is very short-lived.  Plan 1.  Full autoimmune workup, repeat LFTs if autoimmune workup is negative and LFTs are still markedly elevated then we would need to obtain a liver biopsy 2.  Abdominal pain check for H. pylori proceed with upper endoscopy trial of PPI 40 mg Prilosec once a day for 6 to 8 weeks   I have discussed alternative options, risks & benefits,  which include, but are not limited to, bleeding, infection, perforation,respiratory complication & drug reaction.  The patient agrees with this plan & written consent will be obtained.      Follow up in 4-6 weeks after EGD  Dr Wyline Mood MD,MRCP(U.K)

## 2022-07-22 LAB — CK: Total CK: 67 U/L (ref 32–182)

## 2022-07-22 LAB — CBC WITH DIFFERENTIAL/PLATELET
Basos: 2 %
Hematocrit: 41.1 % (ref 34.0–46.6)
Immature Grans (Abs): 0 10*3/uL (ref 0.0–0.1)
Lymphocytes Absolute: 2 10*3/uL (ref 0.7–3.1)
Lymphs: 22 %
MCH: 30.8 pg (ref 26.6–33.0)
RBC: 4.28 x10E6/uL (ref 3.77–5.28)

## 2022-07-22 LAB — COMPREHENSIVE METABOLIC PANEL
BUN/Creatinine Ratio: 19 (ref 12–28)
Globulin, Total: 3.6 g/dL (ref 1.5–4.5)
Glucose: 117 mg/dL — ABNORMAL HIGH (ref 70–99)
Total Protein: 6.9 g/dL (ref 6.0–8.5)

## 2022-07-22 LAB — HIV ANTIBODY (ROUTINE TESTING W REFLEX): HIV Screen 4th Generation wRfx: NONREACTIVE

## 2022-07-22 LAB — IRON,TIBC AND FERRITIN PANEL
Iron: 150 ug/dL — ABNORMAL HIGH (ref 27–139)
UIBC: 53 ug/dL — ABNORMAL LOW (ref 118–369)

## 2022-07-22 LAB — HEPATITIS B E ANTIGEN: Hep B E Ag: NEGATIVE

## 2022-07-22 LAB — HEPATITIS B E ANTIBODY: Hep B E Ab: NONREACTIVE

## 2022-07-22 LAB — CERULOPLASMIN: Ceruloplasmin: 23 mg/dL (ref 19.0–39.0)

## 2022-07-22 NOTE — Progress Notes (Signed)
1. Can we add hemochromotosis gene testing to existing labs add on please if not re order

## 2022-07-23 LAB — HEMOCHROMATOSIS DNA-PCR(C282Y,H63D)

## 2022-07-23 LAB — H. PYLORI BREATH TEST: H pylori Breath Test: NEGATIVE

## 2022-07-23 LAB — SPECIMEN STATUS REPORT

## 2022-07-24 LAB — IRON,TIBC AND FERRITIN PANEL
Ferritin: 1189 ng/mL — ABNORMAL HIGH (ref 15–150)
Iron Saturation: 74 % — ABNORMAL HIGH (ref 15–55)
Total Iron Binding Capacity: 203 ug/dL — ABNORMAL LOW (ref 250–450)

## 2022-07-24 LAB — COMPREHENSIVE METABOLIC PANEL
ALT: 472 IU/L — ABNORMAL HIGH (ref 0–32)
AST: 402 IU/L — ABNORMAL HIGH (ref 0–40)
Albumin/Globulin Ratio: 0.9
Albumin: 3.3 g/dL — ABNORMAL LOW (ref 3.9–4.9)
Alkaline Phosphatase: 250 IU/L — ABNORMAL HIGH (ref 44–121)
BUN: 27 mg/dL (ref 8–27)
Bilirubin Total: 2.5 mg/dL — ABNORMAL HIGH (ref 0.0–1.2)
CO2: 22 mmol/L (ref 20–29)
Calcium: 10.2 mg/dL (ref 8.7–10.3)
Chloride: 104 mmol/L (ref 96–106)
Creatinine, Ser: 1.39 mg/dL — ABNORMAL HIGH (ref 0.57–1.00)
Potassium: 3.9 mmol/L (ref 3.5–5.2)
Sodium: 140 mmol/L (ref 134–144)
eGFR: 42 mL/min/{1.73_m2} — ABNORMAL LOW (ref >59)

## 2022-07-24 LAB — CBC WITH DIFFERENTIAL/PLATELET
Basophils Absolute: 0.1 10*3/uL (ref 0.0–0.2)
EOS (ABSOLUTE): 0.3 10*3/uL (ref 0.0–0.4)
Eos: 3 %
Hemoglobin: 13.2 g/dL (ref 11.1–15.9)
Immature Granulocytes: 0 %
MCHC: 32.1 g/dL (ref 31.5–35.7)
MCV: 96 fL (ref 79–97)
Monocytes Absolute: 1.2 10*3/uL — ABNORMAL HIGH (ref 0.1–0.9)
Monocytes: 13 %
Neutrophils Absolute: 5.7 10*3/uL (ref 1.4–7.0)
Neutrophils: 60 %
Platelets: 227 10*3/uL (ref 150–450)
RDW: 13.4 % (ref 11.7–15.4)
WBC: 9.3 10*3/uL (ref 3.4–10.8)

## 2022-07-24 LAB — MITOCHONDRIAL/SMOOTH MUSCLE AB PNL
Mitochondrial Ab: <20 Units (ref 0.0–20.0)
Smooth Muscle Ab: 18 Units (ref 0–19)

## 2022-07-24 LAB — IMMUNOGLOBULINS A/E/G/M, SERUM
IgA/Immunoglobulin A, Serum: 280 mg/dL (ref 87–352)
IgE (Immunoglobulin E), Serum: 247 IU/mL (ref 6–495)
IgG (Immunoglobin G), Serum: 2073 mg/dL — ABNORMAL HIGH (ref 586–1602)
IgM (Immunoglobulin M), Srm: 186 mg/dL (ref 26–217)

## 2022-07-24 LAB — CELIAC DISEASE AB SCREEN W/RFX
Antigliadin Abs, IgA: 5 units (ref 0–19)
Transglutaminase IgA: <2 U/mL (ref 0–3)

## 2022-07-24 LAB — HEPATITIS B SURFACE ANTIBODY,QUALITATIVE: Hep B Surface Ab, Qual: NONREACTIVE

## 2022-07-24 LAB — ANTI-MICROSOMAL ANTIBODY LIVER / KIDNEY: LKM1 Ab: 1.8 Units (ref 0.0–20.0)

## 2022-07-24 LAB — HEPATITIS B CORE ANTIBODY, TOTAL: Hep B Core Total Ab: NEGATIVE

## 2022-07-24 LAB — ALPHA-1-ANTITRYPSIN: A-1 Antitrypsin: 192 mg/dL — ABNORMAL HIGH (ref 101–187)

## 2022-07-24 LAB — HEPATITIS A ANTIBODY, TOTAL: hep A Total Ab: NEGATIVE

## 2022-07-24 LAB — ANA: Anti Nuclear Antibody (ANA): NEGATIVE

## 2022-07-24 LAB — GAMMA GT: GGT: 357 IU/L — ABNORMAL HIGH (ref 0–60)

## 2022-07-29 NOTE — Progress Notes (Signed)
Inform patient   1 LFTs are still rising ensure not on any new medications Tylenol over-the-counter medications.  2.  Repeat LFTs along with GGT if still going up then would require liver biopsy, touch base with Dr. Servando Snare if I am away  3.  Creatinine has risen to 1.3 ensure patient is not taking any NSAIDs.  The patient will have to have a repeat BMP and follow-up with the PCP since she is on an ACE inhibitor may need to be held.  Please confirm and document in the chart that the primary care office has been made aware of an elevated creatinine and they would need to follow-up with that I am unable to CC Dr. Arlana Pouch on this

## 2022-08-10 ENCOUNTER — Telehealth: Payer: Self-pay

## 2022-08-10 DIAGNOSIS — R7989 Other specified abnormal findings of blood chemistry: Secondary | ICD-10-CM

## 2022-08-10 LAB — HEMOCHROMATOSIS DNA-PCR(C282Y,H63D)

## 2022-08-10 NOTE — Addendum Note (Signed)
Addended by: Adela Ports on: 08/10/2022 02:20 PM   Modules accepted: Orders

## 2022-08-10 NOTE — Telephone Encounter (Signed)
-----   Message from Wyline Mood, MD sent at 07/29/2022  8:23 AM EDT ----- Inform patient   1 LFTs are still rising ensure not on any new medications Tylenol over-the-counter medications.  2.  Repeat LFTs along with GGT if still going up then would require liver biopsy, touch base with Dr. Servando Snare if I am away  3.  Creatinine has risen to 1.3 ensure patient is not taking any NSAIDs.  The patient will have to have a repeat BMP and follow-up with the PCP since she is on an ACE inhibitor may need to be held.  Please confirm and document in the chart that the p rimary care office has been made aware of an elevated creatinine and they would need to follow-up with that I am unable to CC Dr. Arlana Pouch on this

## 2022-08-10 NOTE — Telephone Encounter (Signed)
Called patient to let her know the below information and she agreed on getting labs drawn and for me to notify Dr. Arlana Pouch about her creatinine being elevated. Patient stated  that she will come in next week for labs since she is still on vacation.

## 2022-08-16 NOTE — Progress Notes (Signed)
Can we check why she hasn't got labs done

## 2022-08-18 ENCOUNTER — Telehealth: Payer: Self-pay | Admitting: *Deleted

## 2022-08-18 NOTE — Telephone Encounter (Signed)
Spoken to patient and inform her that we need to reschedule her EGD.  Patient stated that she will have to call back. She is establishing care with a PCP on 08/19/2022 and wants to discuss with her if this is something she should be do going forward.

## 2022-08-21 NOTE — Progress Notes (Signed)
Noted labs at [facility] can we see her sooner - video visit is ok -

## 2022-08-24 NOTE — Telephone Encounter (Signed)
Message left for patient to return my call.  

## 2022-08-26 NOTE — Progress Notes (Signed)
Paige Coffey  Recent creatinine checked by [facility] has gone up even higher- can we check with PCP if renal are already involved or there has been an intervention   I would need to see her sooner than 10/06/2022- can we move one of my existing patients from schedule to [facility] and so I can see her sooner

## 2022-08-27 NOTE — Telephone Encounter (Signed)
Called patient again to reschedule her procedure date and I had to leave her a voicemail.

## 2022-09-02 NOTE — Telephone Encounter (Signed)
Patient called my direct number, she read the MyChart message I have sent last week. She thought the appointment on 09/03/2022 with Dr Tobi Bastos was regarding the EGD.  Inform patient that it is not about the rescheduling but regarding her lab results were elevated.  Reminded her of the appointment, the time, and verified the location of the office.  I did not cancel the EGD because she wanted to discuss with Dr Tobi Bastos any ways.

## 2022-09-02 NOTE — Telephone Encounter (Signed)
Dr. Tobi Bastos, to clarify things, you wanted this patient to have additional labs drawn and an EGD prior to seeing you again, right? However, patient wants to see first and do those things you asked  for. Are you okay to leave her scheduled to see you tomorrow?

## 2022-09-03 ENCOUNTER — Ambulatory Visit: Payer: Medicare HMO | Admitting: Gastroenterology

## 2022-09-03 NOTE — Telephone Encounter (Signed)
Spoke to patient and she stated that she did mention to her PCP about her creatinine being elevated. Her PCP did additional labs. She is waiting for her to receive a call about her lab results and plan. In the meantime, her procedure is on hold since it needed to be rescheduled since the provider was off on 09/10/2022. She is also cancelling her appointment with our provider since she is waiting to hear from her PCP first and because she is doing better. I told her to give [place] a call to let us know what her PCP stated and if she wanted to be seen. Patient understood and had no further questions.

## 2022-09-03 NOTE — Telephone Encounter (Signed)
Thanks

## 2022-09-07 ENCOUNTER — Other Ambulatory Visit: Payer: Self-pay | Admitting: Internal Medicine

## 2022-09-07 DIAGNOSIS — Z1231 Encounter for screening mammogram for malignant neoplasm of breast: Secondary | ICD-10-CM

## 2022-09-10 ENCOUNTER — Encounter: Admission: RE | Payer: Self-pay | Source: Home / Self Care

## 2022-09-10 ENCOUNTER — Ambulatory Visit: Admission: RE | Admit: 2022-09-10 | Payer: Medicare HMO | Source: Home / Self Care | Admitting: Gastroenterology

## 2022-09-10 SURGERY — ESOPHAGOGASTRODUODENOSCOPY (EGD) WITH PROPOFOL
Anesthesia: General

## 2022-10-06 ENCOUNTER — Ambulatory Visit: Payer: Medicare HMO | Admitting: Gastroenterology

## 2022-10-06 ENCOUNTER — Ambulatory Visit
Admission: RE | Admit: 2022-10-06 | Discharge: 2022-10-06 | Disposition: A | Discharge status: Home / Self Care | Payer: Medicare HMO | Source: Ambulatory Visit | Attending: Internal Medicine | Admitting: Internal Medicine

## 2022-10-06 DIAGNOSIS — Z1231 Encounter for screening mammogram for malignant neoplasm of breast: Secondary | ICD-10-CM | POA: Diagnosis present

## 2022-10-11 ENCOUNTER — Other Ambulatory Visit: Payer: Self-pay | Admitting: Gastroenterology

## 2022-10-20 ENCOUNTER — Other Ambulatory Visit: Payer: Self-pay | Admitting: Gastroenterology

## 2023-11-30 ENCOUNTER — Ambulatory Visit: Payer: Self-pay | Admitting: Podiatry

## 2023-11-30 DIAGNOSIS — M7752 Other enthesopathy of left foot: Secondary | ICD-10-CM | POA: Diagnosis not present

## 2023-11-30 NOTE — Progress Notes (Signed)
 Subjective:  Patient ID: Paige Coffey, female    DOB: [birth year],  MRN: [id number]  Chief Complaint  Patient presents with   Foot Pain    Left foot pain  Pt stated that about 2 weeks ago she was having pain in the ball of her foot and on the top she stated that her big toe got red and swollen at one point     68 y.o. female presents with the above complaint.  Patient presents with left first metatarsophalangeal joint pain has been causing her a lot of pain she got red and swollen at 1 point she wanted get it evaluated pain scale 7 out of 10 dull aching nature she has not seen and was prior to seeing me.  She would like to discuss steroid injection at this time.   Review of Systems: Negative except as noted in the HPI. Denies N/V/F/Ch.  No past medical history on file.  Current Outpatient Medications:    BESIVANCE 0.6 % SUSP, Place 1 drop into the left eye 4 (four) times daily., Disp: , Rfl:    Bromfenac Sodium 0.07 % SOLN, Apply 1 drop to eye 2 (two) times daily., Disp: , Rfl:    Difluprednate 0.05 % EMUL, Apply 1 drop to eye 4 (four) times daily., Disp: , Rfl:    gatifloxacin (ZYMAXID) 0.5 % SOLN, 1 drop 4 (four) times daily., Disp: , Rfl:    GAVILYTE-G 236 g solution, Take by mouth as directed., Disp: , Rfl:    indomethacin (INDOCIN SR) 75 MG CR capsule, , Disp: , Rfl:    spironolactone (ALDACTONE) 25 MG tablet, Take 25 mg by mouth daily., Disp: , Rfl:    torsemide (DEMADEX) 5 MG tablet, Take 5 mg by mouth daily., Disp: , Rfl:    traMADol (ULTRAM) 50 MG tablet, Take 50 mg by mouth every 8 (eight) hours as needed., Disp: , Rfl:    aspirin 81 MG EC tablet, Take by mouth., Disp: , Rfl:    atorvastatin (LIPITOR) 20 MG tablet, , Disp: , Rfl:    calcium carbonate (TUMS EX) 750 MG chewable tablet, Chew 1 tablet by mouth daily as needed for heartburn., Disp: , Rfl:    cyanocobalamin (VITAMIN B12) 1000 MCG tablet, Take 1,000 mcg by mouth daily., Disp: , Rfl:    diclofenac (VOLTAREN)  75 MG EC tablet, Take 1 tablet by mouth 2 (two) times daily., Disp: , Rfl:    levothyroxine (SYNTHROID) 100 MCG tablet, levothyroxine 100 mcg tablet  TK 1 T PO QD, Disp: , Rfl:    lisinopril-hydrochlorothiazide (ZESTORETIC) 20-12.5 MG tablet, lisinopril 20 mg-hydrochlorothiazide 12.5 mg tablet  TK 1 T PO D, Disp: , Rfl:    magnesium 30 MG tablet, Take 30 mg by mouth as needed., Disp: , Rfl:    magnesium oxide (MAG-OX) 400 MG tablet, Take 1 tablet by mouth every other day., Disp: , Rfl:    omeprazole  (PRILOSEC) 40 MG capsule, Take 1 capsule (40 mg total) by mouth 2 times daily at 12 noon and 4 pm., Disp: 180 capsule, Rfl: 0  Social History   Tobacco Use  Smoking Status Not on file  Smokeless Tobacco Not on file    No Known Allergies Objective:  There were no vitals filed for this visit. There is no height or weight on file to calculate BMI. Constitutional Well developed. Well nourished.  Vascular Dorsalis pedis pulses palpable bilaterally. Posterior tibial pulses palpable bilaterally. Capillary refill normal to all digits.  No cyanosis  or clubbing noted. Pedal hair growth normal.  Neurologic Normal speech. Oriented to person, place, and time. Epicritic sensation to light touch grossly present bilaterally.  Dermatologic Nails well groomed and normal in appearance. No open wounds. No skin lesions.  Orthopedic: Pain on palpation left first metatarsophalangeal joint pain with range of motion of the joint deep intra-articular pain noted.  No redness noted mild swelling noted.   Radiographs: None Assessment:   1. Capsulitis of metatarsophalangeal (MTP) joint of left foot    Plan:  Patient was evaluated and treated and all questions answered.  Left first MTP capsulitis - All questions and concerns were discussed with the patient in extensive detail.  Given the amount of pain that she is having she would benefit from steroid injection help decrease acute inflammatory component  surgical pain.  Patient agrees with plan to proceed with steroid injection -A steroid injection was performed at left first MTP using 1% plain Lidocaine and 10 mg of Kenalog . This was well tolerated.   No follow-ups on file.
